# Patient Record
Sex: Male | Born: 1975 | Race: White | Hispanic: No | Marital: Married | Smoking: Former smoker
Health system: Southern US, Community
[De-identification: ages and names within clinical notes are randomized; demographics above are authoritative.]

## PROBLEM LIST (undated history)

## (undated) DIAGNOSIS — A6920 Lyme disease, unspecified: Secondary | ICD-10-CM

## (undated) DIAGNOSIS — F419 Anxiety disorder, unspecified: Secondary | ICD-10-CM

## (undated) HISTORY — DX: Anxiety disorder, unspecified: F41.9

## (undated) HISTORY — PX: HERNIA REPAIR: SHX51

## (undated) HISTORY — DX: Lyme disease, unspecified: A69.20

---

## 2008-03-21 ENCOUNTER — Encounter: Admission: RE | Admit: 2008-03-21 | Discharge: 2008-03-21 | Payer: Self-pay | Admitting: *Deleted

## 2008-04-22 ENCOUNTER — Ambulatory Visit (HOSPITAL_BASED_OUTPATIENT_CLINIC_OR_DEPARTMENT_OTHER): Admission: RE | Admit: 2008-04-22 | Discharge: 2008-04-22 | Payer: Self-pay | Admitting: *Deleted

## 2008-12-24 IMAGING — CT CT PELVIS W/ CM
2 of 3 series · 17 of 46 positions shown, 19 images · IV contrast (READICAT/WATER & [ID] OMNI 300)
Comparison: None

CLINICAL DATA: Left groin pain.

CT PELVIS WITH CONTRAST
TECHNIQUE: Multidetector CT imaging of the pelvis was performed
following the standard protocol during administration of
intravenous contrast.
Contrast: 125 ml [id number]

[Series 2: abdomen w/ · axial · 0.76mm/px · z∈[-360,-155]mm · 14 of 47 slices shown, 16 images]
[im 3/47  soft-tissue]
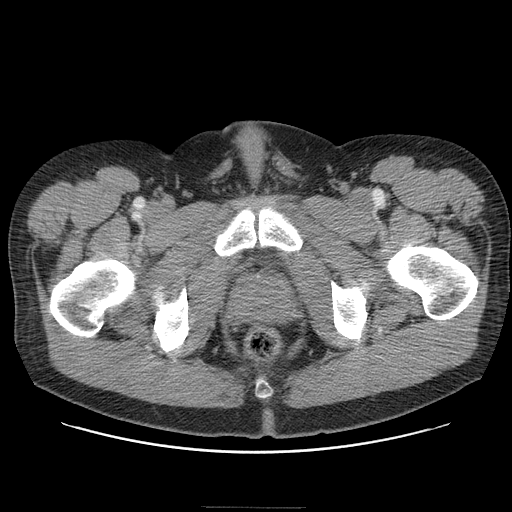
[im 3/47  bone]
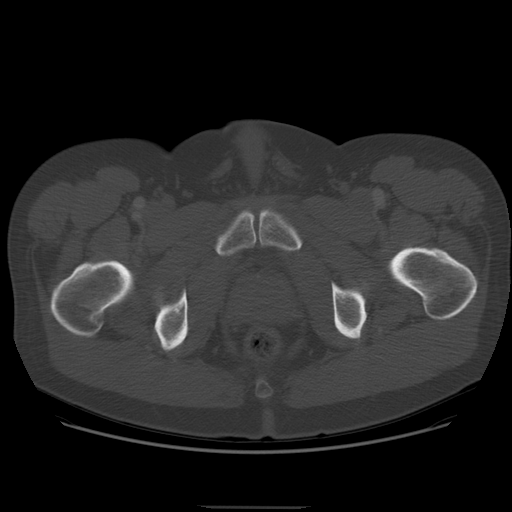
[im 6/47  soft-tissue]
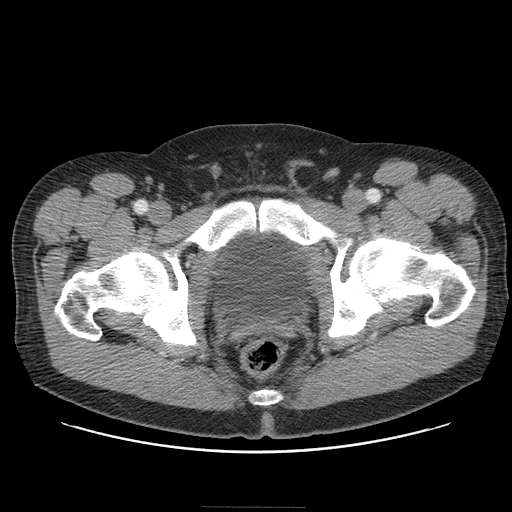
[im 9/47  soft-tissue]
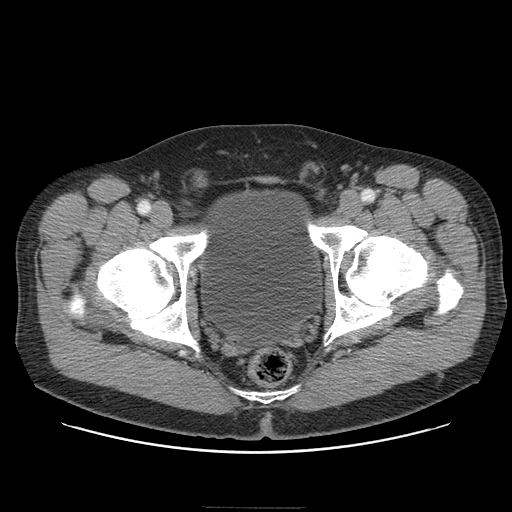
[im 12/47  soft-tissue]
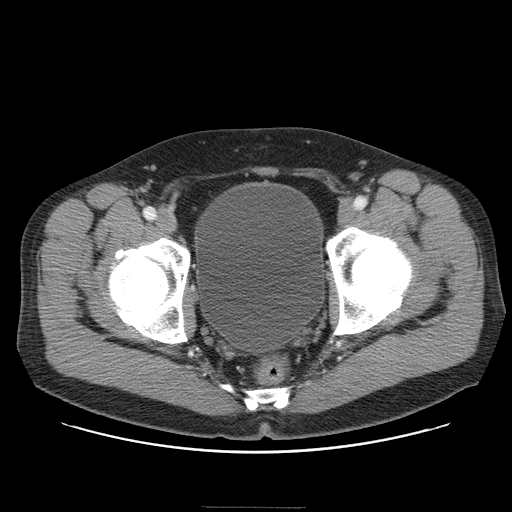
[im 15/47  soft-tissue]
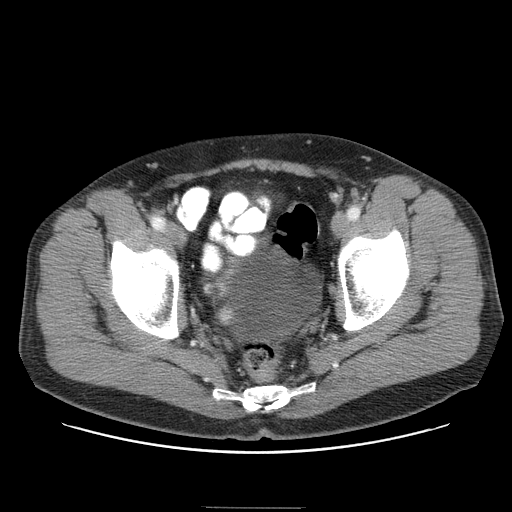
[im 18/47  soft-tissue]
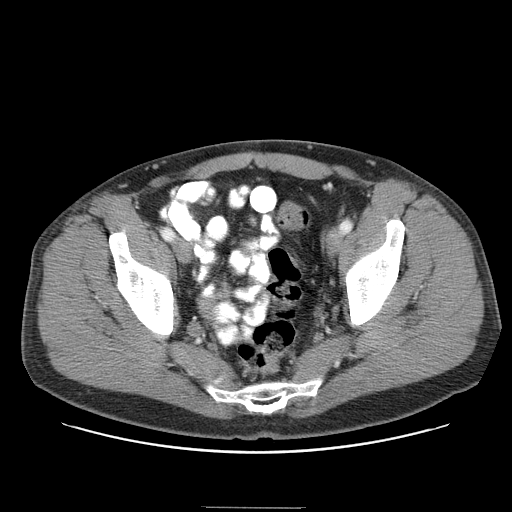
[im 21/47  soft-tissue]
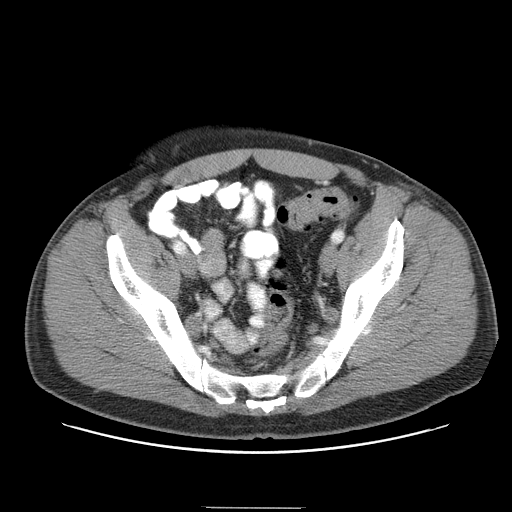
[im 26/47  soft-tissue]
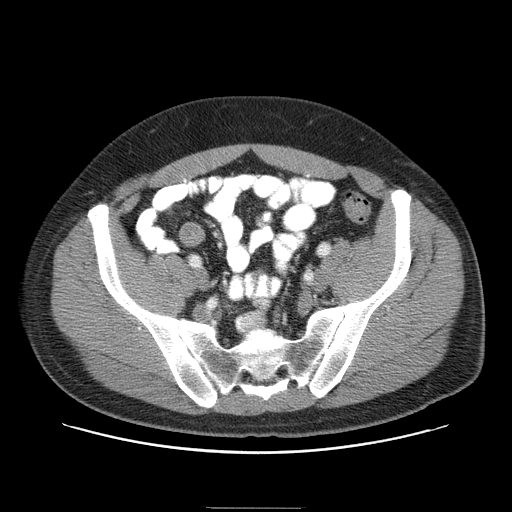
[im 29/47  soft-tissue]
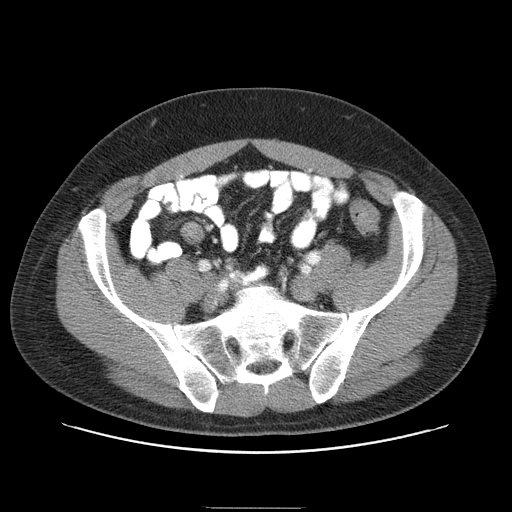
[im 29/47  bone]
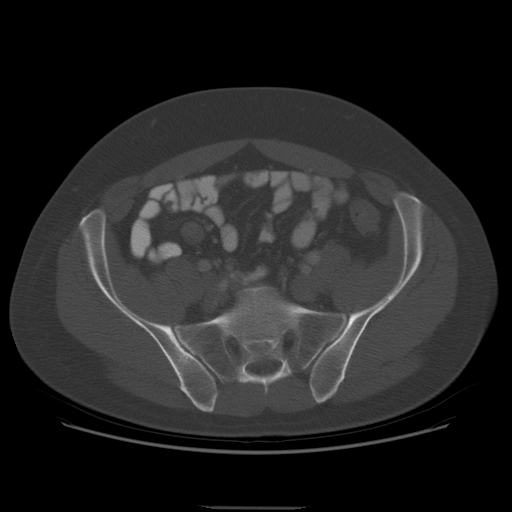
[im 32/47  soft-tissue]
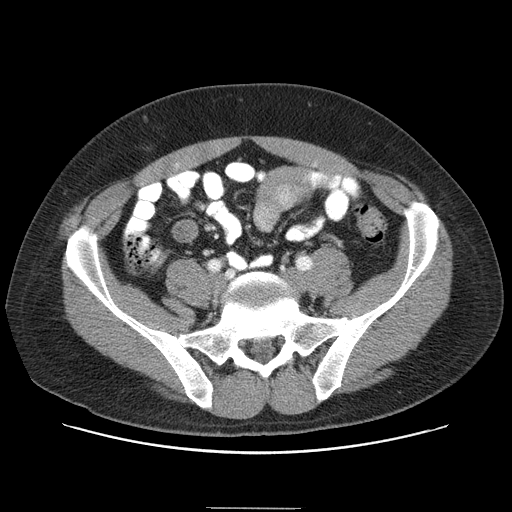
[im 35/47  soft-tissue]
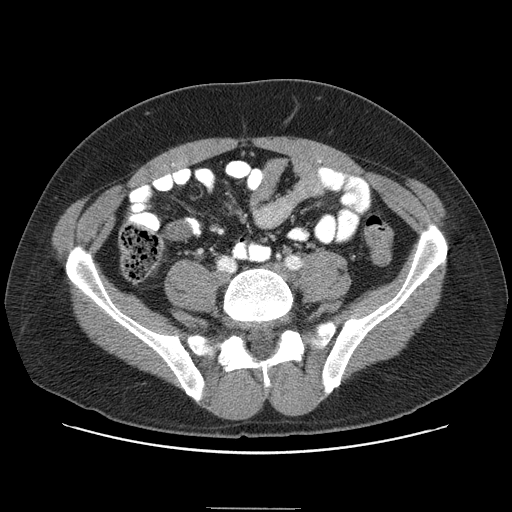
[im 38/47  soft-tissue]
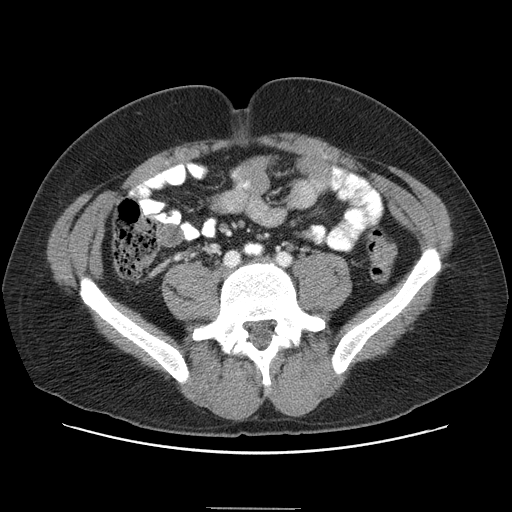
[im 41/47  soft-tissue]
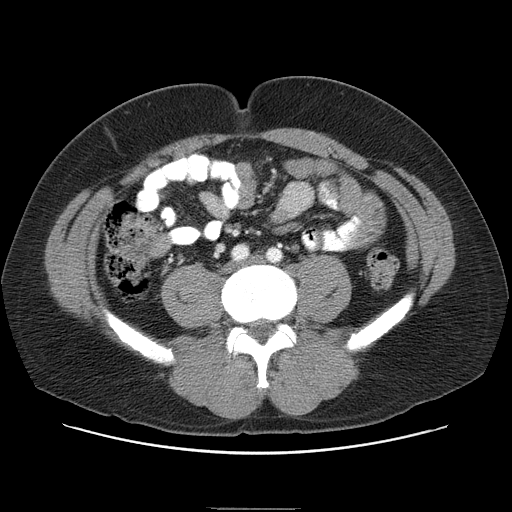
[im 44/47  soft-tissue]
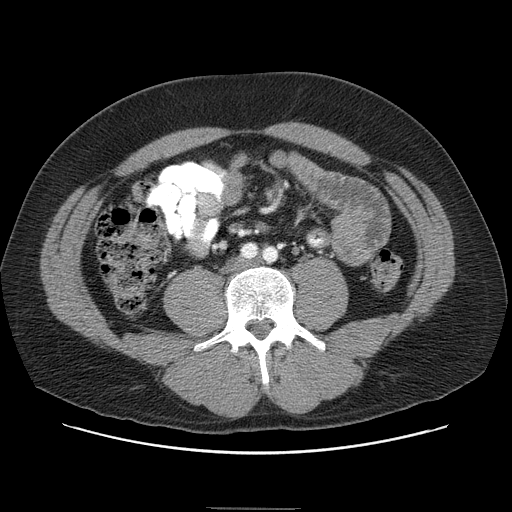

[Series 401: coronal · coronal · 0.76mm/px · 3 of 121 slices shown]
[im 41/121  soft-tissue]
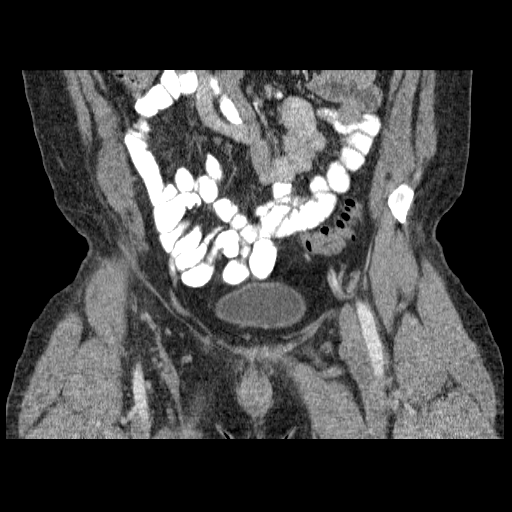
[im 54/121  soft-tissue]
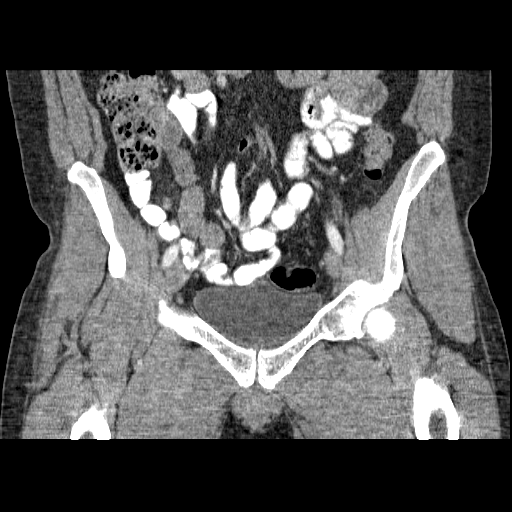
[im 67/121  soft-tissue]
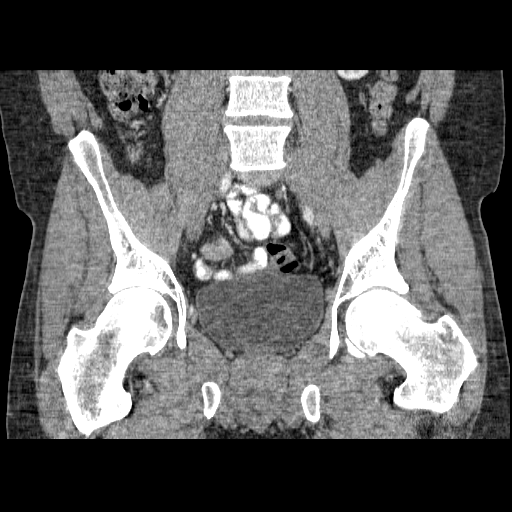

[17 of 46 positions shown; findings below may reference images not displayed]

FINDINGS: The rectum, sigmoid colon and visualized small bowel
loops are unremarkable.  There is mild diverticulosis of the
sigmoid colon but no evidence for acute diverticulitis.

The seminal vesicles, prostate gland and bladder are unremarkable.
No pelvic masses, adenopathy or free pelvic fluid collections.
There are small inguinal hernias bilaterally containing fat.  No
anterior abdominal wall hernia.

Major vascular structures are unremarkable.  The bony pelvis is
intact.
IMPRESSION: 1.  No acute pelvic findings, masses or adenopathy.
2.  Intact bony pelvis.
3.  Small bilateral inguinal hernias containing fat.
4.  Mild diverticulosis of the sigmoid colon.

## 2011-01-11 NOTE — Op Note (Signed)
NAMEJAYTON, Casey Alvarado                 ACCOUNT NO.:  [id number]   MEDICAL RECORD NO.:  [id number]          PATIENT TYPE:  AMB   LOCATION:  [facility]                         FACILITY:  [facility]   PHYSICIAN:  Alfonse Ras, MD   DATE OF BIRTH:  05-Apr-1976   DATE OF PROCEDURE:  DATE OF DISCHARGE:                               OPERATIVE REPORT   PREOPERATIVE DIAGNOSIS:  Bilateral inguinal hernia.   POSTOPERATIVE DIAGNOSIS:  Bilateral inguinal hernia.   PROCEDURES:  Bilateral inguinal hernia repairs with mesh with Ultrapro.   ANESTHESIA:  General.   SURGEON:  Alfonse Ras, M.D.   DESCRIPTION OF PROCEDURE:  The patient was taken to the operating room  and placed in supine position.  After adequate general anesthesia was  induced using endotracheal tube, the abdomen was prepped and draped in  normal sterile fashion.  Using a oblique incision over the left inguinal  region, I dissected down onto the external oblique fascia using Bovie  electrocautery.  This was opened along with fibers.  The ilioinguinal  nerve was identified and retracted laterally.  Blunt dissection was made  down along the inguinal ligament on the transversalis fascia.  Spermatic  cord was surrounded with Penrose drain.  A small lipoma of preperitoneal  fat was identified and transected off the spermatic cord.  A very small  direct hernia defect was identified and closed with interrupted 0  Surgilon sutures approximating the transversalis fascia to the inguinal  ligament and Cooper's ligament.  A piece of 3 x 6 Ultrapro mesh was then  placed over the repair and sutured in place using running 2-0 Prolene  suture, extending at least 1 cm distal to the pubic tubercle along the  inguinal ligament, transversalis fascia split and brought out lateral to  the internal ring.  The external oblique fascia was closed with running  3-0 Vicryl and skin was closed with staples.  All tissues were injected  using Marcaine.   The right  side was opened in a mirror fashion, and I dissected down to  the external oblique fascia which was opened along its fibers.  Ilioinguinal nerve was identified and preserved and retracted laterally.  Spermatic cord was surrounded with Penrose drain.  Small lipoma again  was resected from along side the spermatic cord.  A very, very small  direct hernia defect was identified and closed with interrupted 0  Surgilon sutures approximating inguinal ligament to the transversalis  fascia.  A piece of 3 x 6 Ultrapro mesh was placed in onlay fashion and  sutured in place with a running 2-0 Prolene suture split and brought out  lateral to the spermatic cord and tacked out laterally.  The external  oblique fascia was closed with a running 3-0 Vicryl.  Skin incision was  closed with staples.  All tissues were injected with 0.5 Marcaine.  Sterile dressings were applied.  The patient tolerated the procedure  well, went to PACU in good condition.     Alfonse Ras, MD  Electronically Signed    KRE/MEDQ  D:  04/22/2008  T:  04/22/2008  Job:  [id number]

## 2018-02-02 ENCOUNTER — Ambulatory Visit (INDEPENDENT_AMBULATORY_CARE_PROVIDER_SITE_OTHER): Payer: 59

## 2018-02-02 ENCOUNTER — Ambulatory Visit (INDEPENDENT_AMBULATORY_CARE_PROVIDER_SITE_OTHER): Payer: 59 | Admitting: Physician Assistant

## 2018-02-02 ENCOUNTER — Other Ambulatory Visit: Payer: Self-pay

## 2018-02-02 ENCOUNTER — Encounter: Payer: Self-pay | Admitting: Physician Assistant

## 2018-02-02 VITALS — BP 118/78 | HR 79 | Temp 98.0°F | Resp 16 | Ht 74.75 in | Wt 249.4 lb

## 2018-02-02 DIAGNOSIS — M79671 Pain in right foot: Secondary | ICD-10-CM

## 2018-02-02 NOTE — Progress Notes (Signed)
    02/02/2018 4:19 PM   DOB: 1976/03/04 / MRN: [id number]  SUBJECTIVE:  Casey Alvarado is a 42 y.o. male presenting for right toe pain.  Patient dropped a metal pole on it last night and thinks it may have played up to 20 pounds.  He is got bruising and swelling and exquisite pain with walking.  He is allergic to other.   He  has a past medical history of Anxiety and Lyme disease.    He  reports that he has quit smoking. He has never used smokeless tobacco. He reports that he drinks about 1.2 - 2.4 oz of alcohol per week. He reports that he does not use drugs. He  reports that he currently engages in sexual activity. He reports using the following method of birth control/protection: Rhythm. The patient  has a past surgical history that includes Hernia repair.  His family history includes Cancer in his maternal grandmother; Heart disease in his mother.  Review of Systems  Constitutional: Negative for chills, diaphoresis and fever.  Gastrointestinal: Negative for nausea.  Musculoskeletal: Positive for joint pain.  Skin: Negative for rash.  Neurological: Negative for dizziness.    The problem list and medications were reviewed and updated by myself where necessary and exist elsewhere in the encounter.   OBJECTIVE:  BP 118/78   Pulse 79   Temp 98 F (36.7 C)   Resp 16   Ht 6' 2.75" (1.899 m)   Wt 249 lb 6.4 oz (113.1 kg)   SpO2 98%   BMI 31.38 kg/m   Physical Exam  Constitutional: He is oriented to person, place, and time. He appears well-developed. He does not appear ill.  Eyes: Pupils are equal, round, and reactive to light. Conjunctivae and EOM are normal.  Cardiovascular: Normal rate.  Pulmonary/Chest: Effort normal.  Abdominal: He exhibits no distension.  Musculoskeletal: Normal range of motion.       Feet:  Neurological: He is alert and oriented to person, place, and time. No cranial nerve deficit. Coordination normal.  Skin: Skin is warm and dry. He is not diaphoretic.    Psychiatric: He has a normal mood and affect.  Nursing note and vitals reviewed.   No results found for this or any previous visit (from the past 72 hour(s)).  Dg Foot Complete Right  Result Date: 02/02/2018 CLINICAL DATA:  Right great toe swelling after injury. EXAM: RIGHT FOOT COMPLETE - 3+ VIEW COMPARISON:  None. FINDINGS: There is no evidence of fracture or dislocation. There is no evidence of arthropathy or other focal bone abnormality. Soft tissues are unremarkable. IMPRESSION: Normal right foot. Electronically Signed   By: Lupita RaiderJames  Green Jr, M.D.   On: 02/02/2018 15:56    ASSESSMENT AND PLAN:  Casey Alvarado was seen today for toe pain and establish care.  Diagnoses and all orders for this visit:  Right foot pain: Negative for fracture.  We gave him a postop shoe and this helped reduce his pain immediately.  Advised Tylenol 1000 mg every 8 hours in conjunction with ibuprofen 600 mg every 8 hours as needed. -     DG Foot Complete Right; Future    The patient is advised to call or return to clinic if he does not see an improvement in symptoms, or to seek the care of the closest emergency department if he worsens with the above plan.   Casey Alvarado, MHS, PA-C Primary Care at [facility] 02/02/2018 4:19 PM

## 2018-02-02 NOTE — Patient Instructions (Signed)
     IF you received an x-ray today, you will receive an invoice from [facility]. Please contact [facility] at [phone] with questions or concerns regarding your invoice.   IF you received labwork today, you will receive an invoice from [facility]. Please contact [facility] at [phone] with questions or concerns regarding your invoice.   Our billing staff will not be able to assist you with questions regarding bills from these companies.  You will be contacted with the lab results as soon as they are available. The fastest way to get your results is to activate your My Chart account. Instructions are located on the last page of this paperwork. If you have not heard from us regarding the results in 2 weeks, please contact this office.     

## 2018-11-07 IMAGING — DX DG FOOT COMPLETE 3+V*R*
3 series · 3 of 3 positions shown · non-contrast
Comparison: None.

CLINICAL DATA: Right great toe swelling after injury.

EXAM:
RIGHT FOOT COMPLETE - 3+ VIEW

[foot ap]
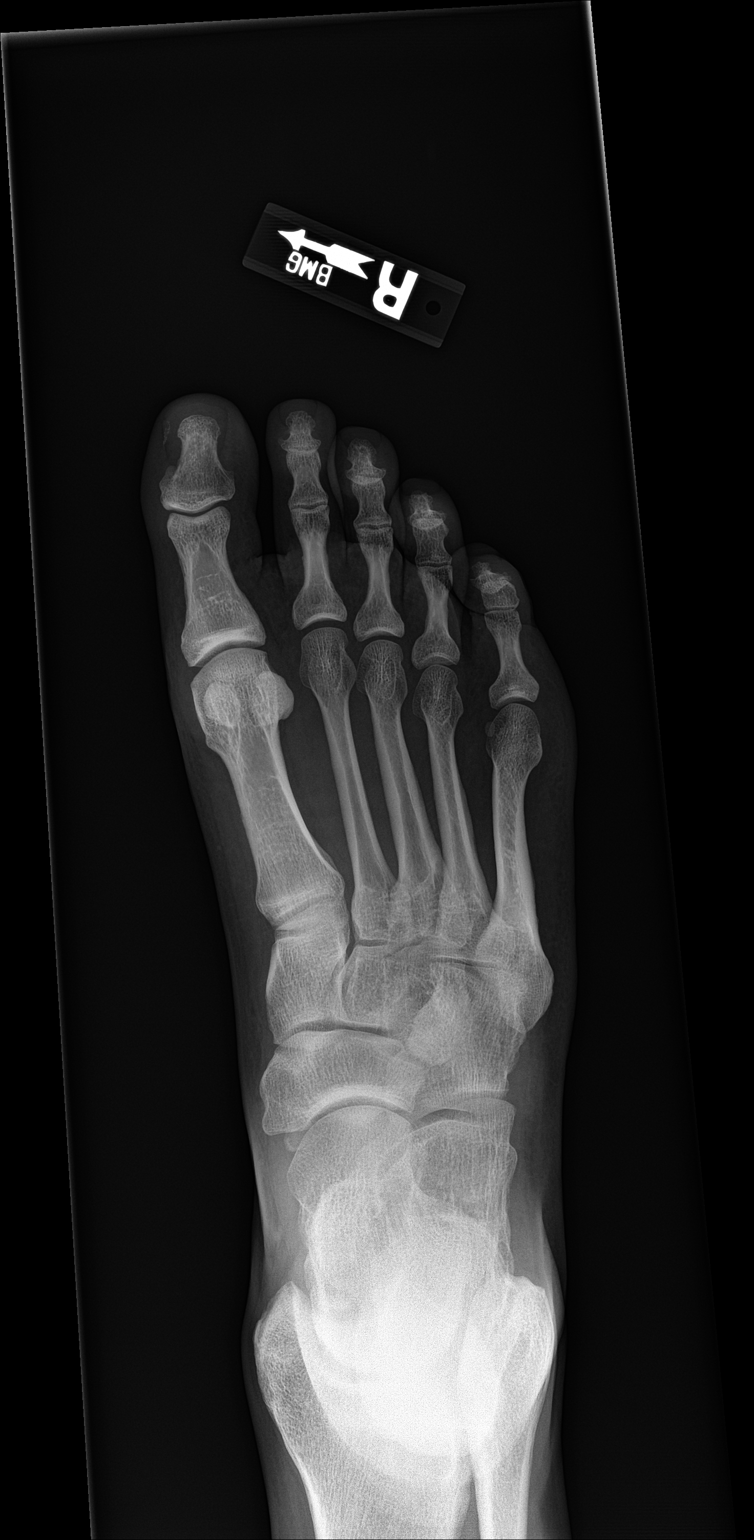

[foot obl]
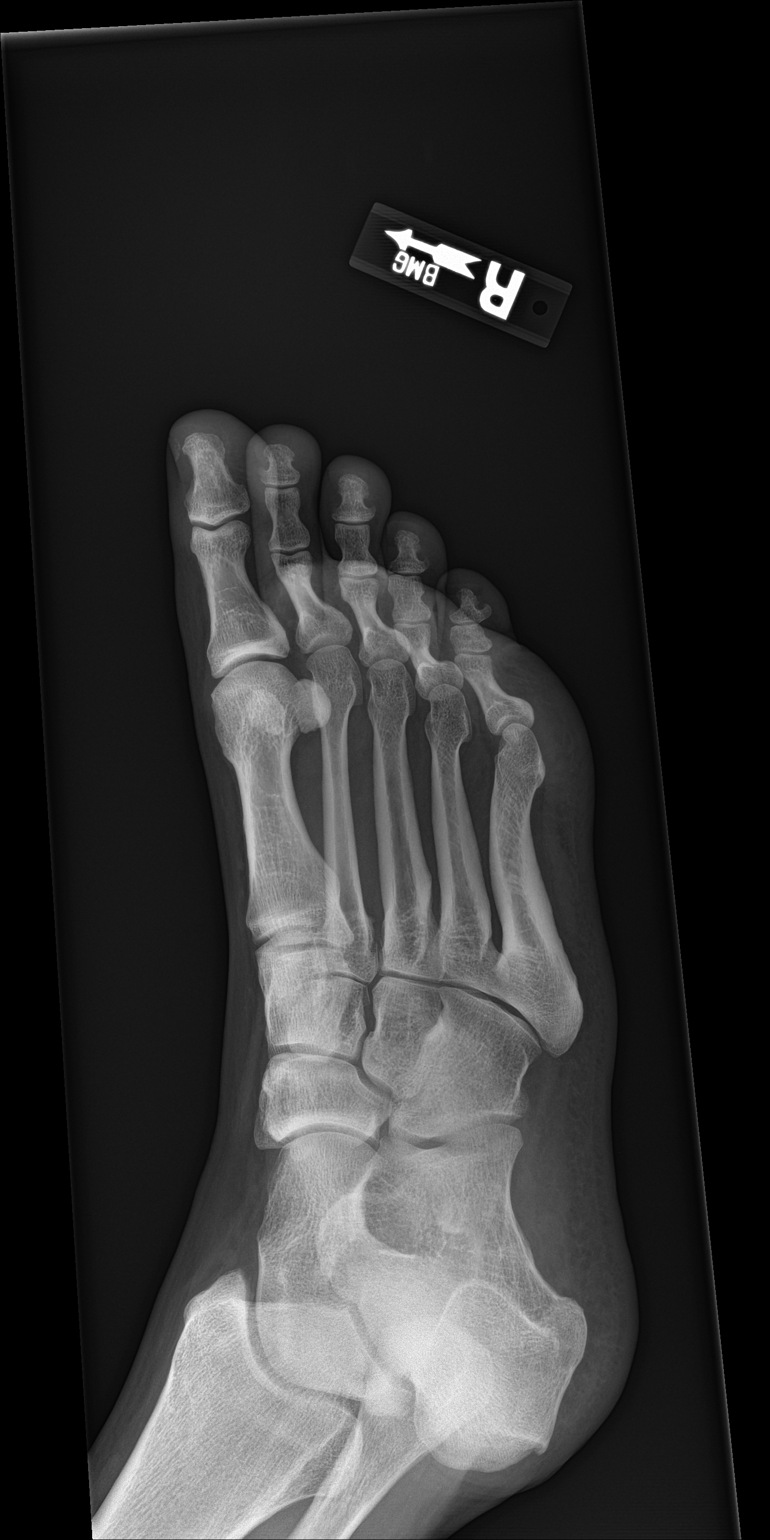

[foot lat]
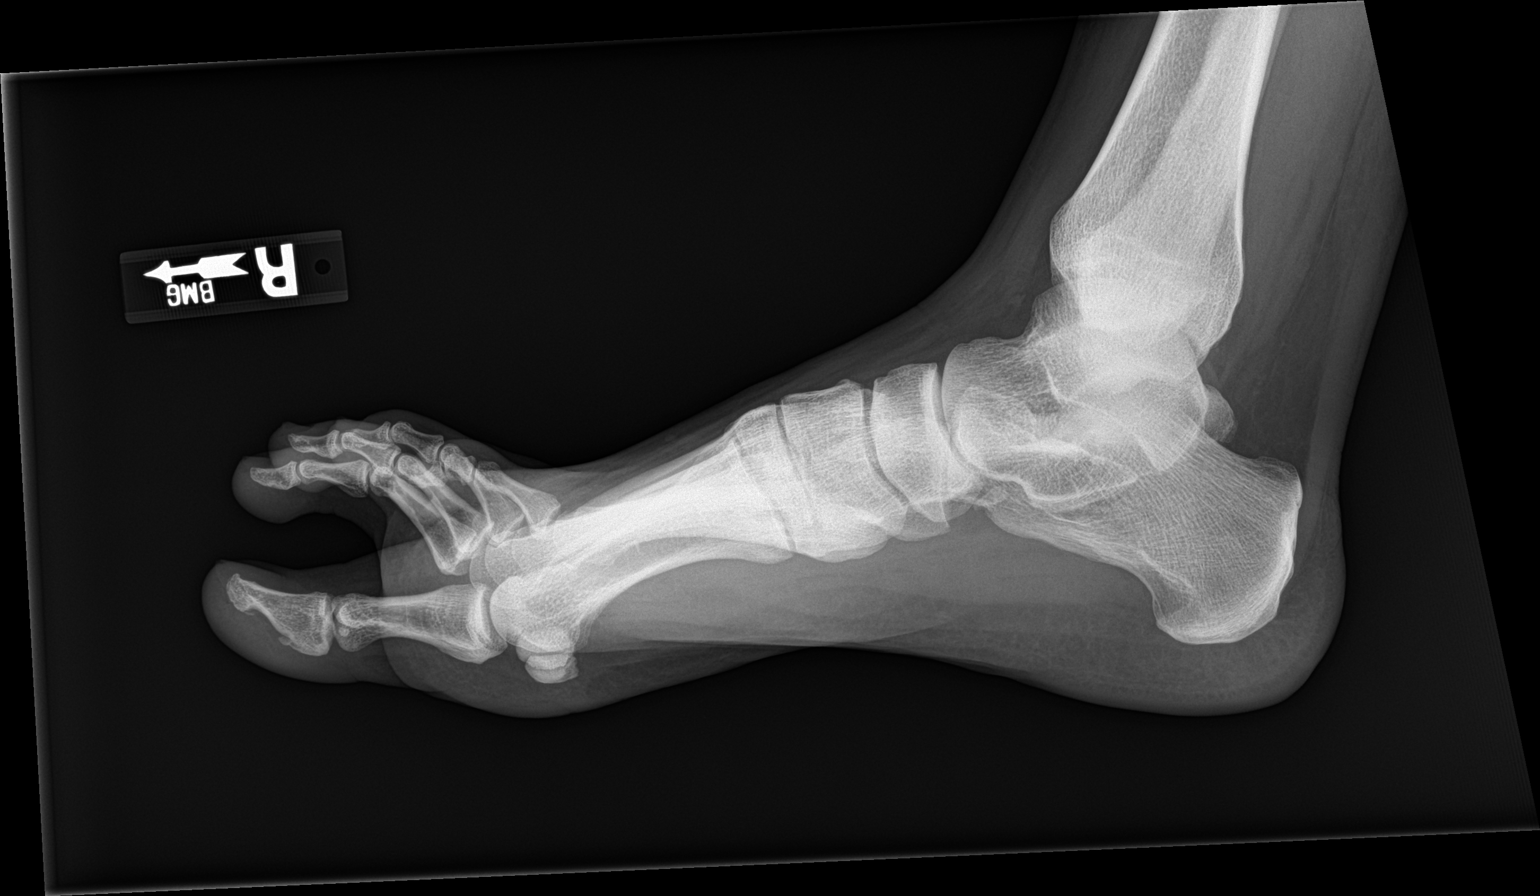

[3 of 3 positions shown; findings below may reference images not displayed]

FINDINGS: There is no evidence of fracture or dislocation. There is no
evidence of arthropathy or other focal bone abnormality. Soft
tissues are unremarkable.
IMPRESSION: Normal right foot.

## 2022-12-16 DIAGNOSIS — Z0000 Encounter for general adult medical examination without abnormal findings: Secondary | ICD-10-CM | POA: Diagnosis not present

## 2022-12-16 DIAGNOSIS — E785 Hyperlipidemia, unspecified: Secondary | ICD-10-CM | POA: Diagnosis not present

## 2023-02-24 DIAGNOSIS — U071 COVID-19: Secondary | ICD-10-CM | POA: Diagnosis not present

## 2023-12-27 ENCOUNTER — Encounter (HOSPITAL_BASED_OUTPATIENT_CLINIC_OR_DEPARTMENT_OTHER): Payer: Self-pay

## 2023-12-27 ENCOUNTER — Ambulatory Visit (HOSPITAL_BASED_OUTPATIENT_CLINIC_OR_DEPARTMENT_OTHER)
Admission: RE | Admit: 2023-12-27 | Discharge: 2023-12-27 | Disposition: A | Discharge status: Home / Self Care | Source: Ambulatory Visit | Attending: Family Medicine | Admitting: Family Medicine

## 2023-12-27 ENCOUNTER — Other Ambulatory Visit (HOSPITAL_BASED_OUTPATIENT_CLINIC_OR_DEPARTMENT_OTHER): Payer: Self-pay

## 2023-12-27 VITALS — BP 116/80 | HR 83 | Temp 98.3°F | Resp 20

## 2023-12-27 DIAGNOSIS — J0130 Acute sphenoidal sinusitis, unspecified: Secondary | ICD-10-CM | POA: Diagnosis not present

## 2023-12-27 MED ORDER — AMOXICILLIN-POT CLAVULANATE 875-125 MG PO TABS
1.0000 | ORAL_TABLET | Freq: Two times a day (BID) | ORAL | 0 refills | Status: AC
  Filled 2023-12-27: qty 14, 7d supply, fill #0

## 2023-12-27 NOTE — ED Provider Notes (Signed)
 Casey Alvarado [facility]    CSN: [id number] Arrival date & time: 12/27/23  0945      History   Chief Complaint Chief Complaint  Patient presents with   Cough    relentless hard cough every night for 2 weeks.  much better during the day.  no fever no other real symptoms.  miserable and not sleeping - Entered by patient    HPI Casey Alvarado is a 48 y.o. male.   Patient is a 48 year old male who presents today with cough for approximately 2 weeks.  The cough is worse at nighttime.  He has had associated postnasal drip and sinus congestion at times.  He does suffer from allergies at times.  Takes Claritin as needed.  He has not been taking this consistently.  He denies any associated fevers, chills, body aches or night sweats. Has had some fatigue and headache.    Cough   Past Medical History:  Diagnosis Date   Anxiety    Lyme disease     There are no active problems to display for this patient.   Past Surgical History:  Procedure Laterality Date   HERNIA REPAIR         Home Medications    Prior to Admission medications   Medication Sig Start Date End Date Taking? Authorizing Provider  amoxicillin-clavulanate (AUGMENTIN) 875-125 MG tablet Take 1 tablet by mouth every 12 (twelve) hours. 12/27/23  Yes Lucia Harm A, FNP  ibuprofen (ADVIL,MOTRIN) 600 MG tablet Take 600 mg by mouth every 6 (six) hours as needed.    [provider]    Family History Family History  Problem Relation Age of Onset   Heart disease Mother    Cancer Maternal Grandmother     Social History Social History   Tobacco Use   Smoking status: Former   Smokeless tobacco: Never  Substance Use Topics   Alcohol use: Yes    Alcohol/week: 2.0 - 4.0 standard drinks of alcohol    Types: 2 - 4 Standard drinks or equivalent per week   Drug use: Never     Allergies   Other   Review of Systems Review of Systems  Respiratory:  Positive for cough.    See HPI  Physical Exam Triage  Vital Signs ED Triage Vitals  Encounter Vitals Group     BP 12/27/23 0956 116/80     Systolic BP Percentile --      Diastolic BP Percentile --      Pulse Rate 12/27/23 0956 83     Resp 12/27/23 0956 20     Temp 12/27/23 0956 98.3 F (36.8 C)     Temp Source 12/27/23 0956 Oral     SpO2 12/27/23 0956 98 %     Weight --      Height --      Head Circumference --      Peak Flow --      Pain Score 12/27/23 0958 0     Pain Loc --      Pain Education --      Exclude from Growth Chart --    No data found.  Updated Vital Signs BP 116/80 (BP Location: Right Arm)   Pulse 83   Temp 98.3 F (36.8 C) (Oral)   Resp 20   SpO2 98%   Visual Acuity Right Eye Distance:   Left Eye Distance:   Bilateral Distance:    Right Eye Near:   Left Eye Near:    Bilateral  Near:     Physical Exam Vitals and nursing note reviewed.  Constitutional:      Appearance: Normal appearance.  HENT:     Right Ear: Tympanic membrane, ear canal and external ear normal.     Left Ear: Tympanic membrane, ear canal and external ear normal.     Mouth/Throat:     Comments: PND Eyes:     Conjunctiva/sclera: Conjunctivae normal.  Cardiovascular:     Rate and Rhythm: Normal rate and regular rhythm.     Pulses: Normal pulses.     Heart sounds: Normal heart sounds.  Pulmonary:     Effort: Pulmonary effort is normal.     Breath sounds: Normal breath sounds.  Musculoskeletal:        General: Normal range of motion.  Skin:    General: Skin is warm and dry.  Neurological:     Mental Status: He is alert.  Psychiatric:        Mood and Affect: Mood normal.      UC Treatments / Results  Labs (all labs ordered are listed, but only abnormal results are displayed) Labs Reviewed - No data to display  EKG   Radiology No results found.  Procedures Procedures (including critical care time)  Medications Ordered in UC Medications - No data to display  Initial Impression / Assessment and Plan / UC Course   I have reviewed the triage vital signs and the nursing notes.  Pertinent labs & imaging results that were available during my care of the patient were reviewed by me and considered in my medical decision making (see chart for details).     Acute sinusitis-pleated the drainage from his sinuses causing cough.  Most likely sinus infection based on the fact that has been present for approximate 2 weeks.  Will go and treat with antibiotics at this time.  Recommended Zyrtec and Flonase daily. Follow-up as needed Final Clinical Impressions(s) / UC Diagnoses   Final diagnoses:  Acute non-recurrent sphenoidal sinusitis     Discharge Instructions      Treating you for a sinus infection.  Take the amoxicillin as prescribed.  Recommend Zyrtec and Flonase daily.  Follow-up as needed    ED Prescriptions     Medication Sig Dispense Auth. Provider   amoxicillin-clavulanate (AUGMENTIN) 875-125 MG tablet Take 1 tablet by mouth every 12 (twelve) hours. 14 tablet Landa Pine, FNP      PDMP not reviewed this encounter.   Landa Pine, FNP 12/27/23 1126

## 2023-12-27 NOTE — Discharge Instructions (Signed)
 Treating you for a sinus infection.  Take the amoxicillin as prescribed.  Recommend Zyrtec and Flonase daily.  Follow-up as needed

## 2023-12-27 NOTE — ED Triage Notes (Signed)
 Cough x 2 weeks. Cough is worse at night and having trouble sleeping. Having some sinus drainage which was worse 2 weeks ago.
